# Patient Record
Sex: Female | Born: 1990 | Race: Black or African American | Hispanic: No | Marital: Single | Smoking: Never smoker
Health system: Southern US, Community
[De-identification: ages and names within clinical notes are randomized; demographics above are authoritative.]

---

## 2014-09-10 ENCOUNTER — Emergency Department (HOSPITAL_COMMUNITY): Payer: Self-pay

## 2014-09-10 ENCOUNTER — Emergency Department (HOSPITAL_COMMUNITY)
Admission: EM | Admit: 2014-09-10 | Discharge: 2014-09-10 | Disposition: A | Discharge status: Home / Self Care | Payer: Self-pay | Attending: Emergency Medicine | Admitting: Emergency Medicine

## 2014-09-10 ENCOUNTER — Encounter (HOSPITAL_COMMUNITY): Payer: Self-pay | Admitting: Emergency Medicine

## 2014-09-10 DIAGNOSIS — Z3202 Encounter for pregnancy test, result negative: Secondary | ICD-10-CM | POA: Insufficient documentation

## 2014-09-10 DIAGNOSIS — Z79899 Other long term (current) drug therapy: Secondary | ICD-10-CM | POA: Insufficient documentation

## 2014-09-10 DIAGNOSIS — R1031 Right lower quadrant pain: Secondary | ICD-10-CM | POA: Insufficient documentation

## 2014-09-10 DIAGNOSIS — R197 Diarrhea, unspecified: Secondary | ICD-10-CM | POA: Insufficient documentation

## 2014-09-10 DIAGNOSIS — D649 Anemia, unspecified: Secondary | ICD-10-CM | POA: Insufficient documentation

## 2014-09-10 LAB — COMPREHENSIVE METABOLIC PANEL
ALT: 13 U/L (ref 0–35)
AST: 16 U/L (ref 0–37)
Albumin: 3.8 g/dL (ref 3.5–5.2)
Alkaline Phosphatase: 61 U/L (ref 39–117)
Anion gap: 6 (ref 5–15)
BUN: 13 mg/dL (ref 6–23)
CHLORIDE: 105 mmol/L (ref 96–112)
CO2: 29 mmol/L (ref 19–32)
Calcium: 9.1 mg/dL (ref 8.4–10.5)
Creatinine, Ser: 0.7 mg/dL (ref 0.50–1.10)
GFR calc Af Amer: >90 mL/min (ref >90)
Glucose, Bld: 106 mg/dL — ABNORMAL HIGH (ref 70–99)
Potassium: 3.6 mmol/L (ref 3.5–5.1)
SODIUM: 140 mmol/L (ref 135–145)
Total Bilirubin: 0.8 mg/dL (ref 0.3–1.2)
Total Protein: 7.6 g/dL (ref 6.0–8.3)

## 2014-09-10 LAB — CBC WITH DIFFERENTIAL/PLATELET
Basophils Absolute: 0.1 10*3/uL (ref 0.0–0.1)
Basophils Relative: 1 % (ref 0–1)
Eosinophils Absolute: 0.3 10*3/uL (ref 0.0–0.7)
Eosinophils Relative: 4 % (ref 0–5)
HCT: 34.1 % — ABNORMAL LOW (ref 36.0–46.0)
HEMOGLOBIN: 11.5 g/dL — AB (ref 12.0–15.0)
Lymphocytes Relative: 28 % (ref 12–46)
Lymphs Abs: 2.3 10*3/uL (ref 0.7–4.0)
MCH: 29.9 pg (ref 26.0–34.0)
MCHC: 33.7 g/dL (ref 30.0–36.0)
MCV: 88.6 fL (ref 78.0–100.0)
Monocytes Absolute: 0.8 10*3/uL (ref 0.1–1.0)
Monocytes Relative: 10 % (ref 3–12)
NEUTROS ABS: 4.8 10*3/uL (ref 1.7–7.7)
NEUTROS PCT: 57 % (ref 43–77)
PLATELETS: 315 10*3/uL (ref 150–400)
RBC: 3.85 MIL/uL — ABNORMAL LOW (ref 3.87–5.11)
RDW: 11.8 % (ref 11.5–15.5)
WBC: 8.3 10*3/uL (ref 4.0–10.5)

## 2014-09-10 LAB — URINALYSIS, ROUTINE W REFLEX MICROSCOPIC
Bilirubin Urine: NEGATIVE
Glucose, UA: NEGATIVE mg/dL
Ketones, ur: NEGATIVE mg/dL
Leukocytes, UA: NEGATIVE
Nitrite: NEGATIVE
PH: 6 (ref 5.0–8.0)
Protein, ur: NEGATIVE mg/dL
SPECIFIC GRAVITY, URINE: 1.033 — AB (ref 1.005–1.030)
UROBILINOGEN UA: 1 mg/dL (ref 0.0–1.0)

## 2014-09-10 LAB — URINE MICROSCOPIC-ADD ON

## 2014-09-10 LAB — LIPASE, BLOOD: LIPASE: 28 U/L (ref 11–59)

## 2014-09-10 LAB — PREGNANCY, URINE: Preg Test, Ur: NEGATIVE

## 2014-09-10 MED ORDER — IOHEXOL 300 MG/ML  SOLN
50.0000 mL | Freq: Once | INTRAMUSCULAR | Status: AC | PRN
  Administered 2014-09-10: 50 mL via ORAL

## 2014-09-10 MED ORDER — MORPHINE SULFATE 4 MG/ML IJ SOLN
4.0000 mg | Freq: Once | INTRAMUSCULAR | Status: AC
  Administered 2014-09-10: 4 mg via INTRAVENOUS
  Filled 2014-09-10: qty 1

## 2014-09-10 MED ORDER — IOHEXOL 300 MG/ML  SOLN
100.0000 mL | Freq: Once | INTRAMUSCULAR | Status: AC | PRN
  Administered 2014-09-10: 100 mL via INTRAVENOUS

## 2014-09-10 NOTE — ED Notes (Signed)
Pt ambulating independently w/ steady gait on d/c in no acute distress, A&Ox4.D/c instructions reviewed w/ pt and family - pt and family deny any further questions or concerns at present.  

## 2014-09-10 NOTE — ED Provider Notes (Signed)
CSN: [id number]     Arrival date & time 09/10/14  1600 History   First MD Initiated Contact with Patient 09/10/14 1750     Chief Complaint  Patient presents with  . Abdominal Pain  . Diarrhea     (Consider location/radiation/quality/duration/timing/severity/associated sxs/prior Treatment) The history is provided by the patient and medical records. No language interpreter was used.      Sabrina Rocha is a 24 y.o. female  with no medical Hx presents to the Emergency Department complaining of gradual, intermittent, progressively worsening generalized abd pain worse in the right lower quadrant onset 3 days ago, persistent until today but currently resolved.  Associated symptoms include multiple episodes of diarrhea each day described as green and soft.  Pt denies melena or hematochezia.  Pt reports her abd pain is sharp in nature, rated at a 8/10.  Pt reports the pain only goes away when she sleeps and then returns approx 30 min after she awakes.  She reports she has been eating, but this makes the pain worse.  Pt reports she has been taking Pepto-Bismol relief.  Patient reports that the day before abdominal pain began her bilateral legs were aching but they resolved by that evening and she's had no further leg pain. She denies increased abdominal pain with walking. Pt denies fever, chills, headache, neck pain, chest pain, SOB, nausea, vomiting, weakness, dizziness, syncope, dysuria, hematuria.  Patient reports vaginal discharge no worse or different than usual. She reports she is in a monogamous same-sex relationship and has never had an STD.  LMP: Aug 10, 2014.  No abdominal surgeries.  No recent heavy lifting with her job.     History reviewed. No pertinent past medical history. History reviewed. No pertinent past surgical history. No family history on file. History  Substance Use Topics  . Smoking status: Never Smoker   . Smokeless tobacco: Not on file  . Alcohol Use: No   OB History     No data available     Review of Systems  Constitutional: Negative for fever, diaphoresis, appetite change, fatigue and unexpected weight change.  HENT: Negative for mouth sores.   Eyes: Negative for visual disturbance.  Respiratory: Negative for cough, chest tightness, shortness of breath and wheezing.   Cardiovascular: Negative for chest pain.  Gastrointestinal: Positive for abdominal pain and diarrhea. Negative for nausea, vomiting and constipation.  Endocrine: Negative for polydipsia, polyphagia and polyuria.  Genitourinary: Negative for dysuria, urgency, frequency and hematuria.  Musculoskeletal: Negative for back pain and neck stiffness.  Skin: Negative for rash.  Allergic/Immunologic: Negative for immunocompromised state.  Neurological: Negative for syncope, light-headedness and headaches.  Hematological: Does not bruise/bleed easily.  Psychiatric/Behavioral: Negative for sleep disturbance. The patient is not nervous/anxious.       Allergies  Review of patient's allergies indicates no known allergies.  Home Medications   Prior to Admission medications   Medication Sig Start Date End Date Taking? Authorizing Provider  Chlorpheniramine Maleate (ALLERGY PO) Take 1 tablet by mouth daily.   Yes Historical Provider, MD  Pseudoeph-Doxylamine-DM-APAP (NYQUIL PO) Take 2 capsules by mouth 2 (two) times daily as needed (cold symptoms).   Yes Historical Provider, MD  pseudoephedrine-acetaminophen (TYLENOL SINUS) 30-500 MG TABS Take 1 tablet by mouth every 4 (four) hours as needed (sinus congestion).   Yes Historical Provider, MD   BP 103/60 mmHg  Pulse 71  Resp 16  SpO2 100%  LMP 08/10/2014 (Exact Date) Physical Exam  Constitutional: She appears well-developed and well-nourished. No  distress.  Awake, alert, nontoxic appearance  HENT:  Head: Normocephalic and atraumatic.  Mouth/Throat: Oropharynx is clear and moist. No oropharyngeal exudate.  Eyes: Conjunctivae are normal. No  scleral icterus.  Neck: Normal range of motion. Neck supple.  Cardiovascular: Normal rate, regular rhythm, normal heart sounds and intact distal pulses.   No murmur heard. No tachycardia  Pulmonary/Chest: Effort normal and breath sounds normal. No respiratory distress. She has no wheezes.  Equal chest expansion  Abdominal: Soft. Bowel sounds are normal. She exhibits no distension and no mass. There is no tenderness. There is no rebound, no guarding, no CVA tenderness and no tenderness at McBurney's point.  Abdomen soft and nontender No rebound or guarding No CVA tenderness  Musculoskeletal: Normal range of motion. She exhibits no edema.  Neurological: She is alert.  Speech is clear and goal oriented Moves extremities without ataxia  Skin: Skin is warm and dry. She is not diaphoretic.  Psychiatric: She has a normal mood and affect.  Nursing note and vitals reviewed.   ED Course  Procedures (including critical care time) Labs Review Labs Reviewed  CBC WITH DIFFERENTIAL/PLATELET - Abnormal; Notable for the following:    RBC 3.85 (*)    Hemoglobin 11.5 (*)    HCT 34.1 (*)    All other components within normal limits  COMPREHENSIVE METABOLIC PANEL - Abnormal; Notable for the following:    Glucose, Bld 106 (*)    All other components within normal limits  URINALYSIS, ROUTINE W REFLEX MICROSCOPIC - Abnormal; Notable for the following:    Specific Gravity, Urine 1.033 (*)    Hgb urine dipstick SMALL (*)    All other components within normal limits  URINE MICROSCOPIC-ADD ON - Abnormal; Notable for the following:    Bacteria, UA FEW (*)    All other components within normal limits  WET PREP, GENITAL  LIPASE, BLOOD  PREGNANCY, URINE  RPR  HIV ANTIBODY (ROUTINE TESTING)  POC URINE PREG, ED  GC/CHLAMYDIA PROBE AMP ([place])    Imaging Review Ct Abdomen Pelvis W Contrast  09/10/2014   CLINICAL DATA:  Abdominal pain and diarrhea. Initial encounter. Mid abdominal pain extending  to the pelvic area.  EXAM: CT ABDOMEN AND PELVIS WITH CONTRAST  TECHNIQUE: Multidetector CT imaging of the abdomen and pelvis was performed using the standard protocol following bolus administration of intravenous contrast.  CONTRAST:  50mL OMNIPAQUE IOHEXOL 300 MG/ML SOLN, OMNIPAQUE IOHEXOL 300 MG/ML SOLN  COMPARISON:  None.  FINDINGS: Musculoskeletal:  No aggressive osseous lesions.  Lung Bases: Clear.  Liver: Tiny low-density lesion in the RIGHT hepatic dome probably represents a cyst.  Spleen:  Normal.  Gallbladder:  Normal.  Common bile duct:  Normal.  Pancreas:  Normal.  Adrenal glands:  Normal bilaterally.  Kidneys: Normal enhancement. No calculi. Grossly, ureters appear within normal limits. Evaluation is degraded by paucity of abdominal fat.  Stomach:  Normal.  Small bowel: Normal. No obstruction. Contrast reaches the proximal ileum. There is no contrast in the terminal ileum or the ileocecal valve.  Colon: The appendix is not confidently identified. No definite RIGHT lower quadrant inflammatory changes. No inflammatory changes of colon.  Pelvic Genitourinary: Physiologic appearance of the urinary bladder, normal urinary bladder. Physiologic appearance of the uterus and adnexa.  Peritoneum: No free air or free fluid.  Vasculature: Normal.  Body Wall: Normal.  IMPRESSION: Negative CT abdomen and pelvis.   Electronically Signed   By: Andreas Newport M.D.   On: 09/10/2014 21:29  EKG Interpretation None      MDM   Final diagnoses:  RLQ abdominal pain  Diarrhea    Sabrina Rocha presents with RLQ abd pain, currently asymptomatic but reports that she was hurting significantly prior to arrival.  Abdomen soft and nontender. Patient with vaginal discharge unchanged from previous. Will obtain pelvic exam, CT scan and reassess.  10:20 PM Labs reassuring. Urinalysis without evidence of urinary tract infection and CBC without leukocytosis. Mild anemia at 11.5. Unknown patient baseline however  she denies shortness of breath, tachycardia, fatigue or weakness. Pregnant test negative.  Patient without episodes of emesis or diarrhea here in the emergency department. No return of her pain. CT scan without evidence of acute abnormality. The appendix is not identified however there is no evidence of right lower quadrant inflammatory changes.  Patient declines her pelvic exam at this time. I have cautioned her against my concerns about the potential for PID and vaginal infections.  Patient is resting comfortably and I do not believe that she has an ovarian torsion.  She states understanding of risk to decline her pelvic exam and continues to decline.  Patient given strict return precautions including fevers, persistent vomiting, worsening lower abdominal pain or any other concerning symptoms.  Patient is nontoxic, nonseptic appearing, in no apparent distress.  Patient's pain and other symptoms adequately managed in emergency department.  Fluid bolus given.  Labs, imaging and vitals reviewed.  Patient does not meet the SIRS or Sepsis criteria.  On repeat exam patient does not have a surgical abdomin and there are no peritoneal signs.  No indication of appendicitis, bowel obstruction, bowel perforation, cholecystitis, diverticulitis, or ectopic pregnancy.    I have personally reviewed patient's vitals, nursing note and any pertinent labs or imaging.  I performed an undressed physical exam.    It has been determined that no acute conditions requiring further emergency intervention are present at this time. The patient/guardian have been advised of the diagnosis and plan. I reviewed all labs and imaging including any potential incidental findings. We have discussed signs and symptoms that warrant return to the ED and they are listed in the discharge instructions.    Vital signs are stable at discharge.   BP 103/60 mmHg  Pulse 71  Resp 16  SpO2 100%  LMP 08/10/2014 (Exact  Date)          Dierdre ForthHannah Donnica Jarnagin, PA-C 09/10/14 2222  Ethelda ChickMartha K Linker, MD 09/10/14 2226

## 2014-09-10 NOTE — Discharge Instructions (Signed)
1. Medications: imodium for diarrhea, tylenol for abd pain as needed , usual home medications 2. Treatment: rest, drink plenty of fluids,  3. Follow Up: Please followup with your primary doctor in 2 days for discussion of your diagnoses and further evaluation after today's visit; if you do not have a primary care doctor use the resource guide provided to find one; Please return to the ER for return of pain, change in vaginal discharge, vomiting, fevers or other concerns   Abdominal Pain, Women Abdominal (stomach, pelvic, or belly) pain can be caused by many things. It is important to tell your doctor:  The location of the pain.  Does it come and go or is it present all the time?  Are there things that start the pain (eating certain foods, exercise)?  Are there other symptoms associated with the pain (fever, nausea, vomiting, diarrhea)? All of this is helpful to know when trying to find the cause of the pain. CAUSES   Stomach: virus or bacteria infection, or ulcer.  Intestine: appendicitis (inflamed appendix), regional ileitis (Crohn's disease), ulcerative colitis (inflamed colon), irritable bowel syndrome, diverticulitis (inflamed diverticulum of the colon), or cancer of the stomach or intestine.  Gallbladder disease or stones in the gallbladder.  Kidney disease, kidney stones, or infection.  Pancreas infection or cancer.  Fibromyalgia (pain disorder).  Diseases of the female organs:  Uterus: fibroid (non-cancerous) tumors or infection.  Fallopian tubes: infection or tubal pregnancy.  Ovary: cysts or tumors.  Pelvic adhesions (scar tissue).  Endometriosis (uterus lining tissue growing in the pelvis and on the pelvic organs).  Pelvic congestion syndrome (female organs filling up with blood just before the menstrual period).  Pain with the menstrual period.  Pain with ovulation (producing an egg).  Pain with an IUD (intrauterine device, birth control) in the  uterus.  Cancer of the female organs.  Functional pain (pain not caused by a disease, may improve without treatment).  Psychological pain.  Depression. DIAGNOSIS  Your doctor will decide the seriousness of your pain by doing an examination.  Blood tests.  X-rays.  Ultrasound.  CT scan (computed tomography, special type of X-ray).  MRI (magnetic resonance imaging).  Cultures, for infection.  Barium enema (dye inserted in the large intestine, to better view it with X-rays).  Colonoscopy (looking in intestine with a lighted tube).  Laparoscopy (minor surgery, looking in abdomen with a lighted tube).  Major abdominal exploratory surgery (looking in abdomen with a large incision). TREATMENT  The treatment will depend on the cause of the pain.   Many cases can be observed and treated at home.  Over-the-counter medicines recommended by your caregiver.  Prescription medicine.  Antibiotics, for infection.  Birth control pills, for painful periods or for ovulation pain.  Hormone treatment, for endometriosis.  Nerve blocking injections.  Physical therapy.  Antidepressants.  Counseling with a psychologist or psychiatrist.  Minor or major surgery. HOME CARE INSTRUCTIONS   Do not take laxatives, unless directed by your caregiver.  Take over-the-counter pain medicine only if ordered by your caregiver. Do not take aspirin because it can cause an upset stomach or bleeding.  Try a clear liquid diet (broth or water) as ordered by your caregiver. Slowly move to a bland diet, as tolerated, if the pain is related to the stomach or intestine.  Have a thermometer and take your temperature several times a day, and record it.  Bed rest and sleep, if it helps the pain.  Avoid sexual intercourse, if it causes  pain.  Avoid stressful situations.  Keep your follow-up appointments and tests, as your caregiver orders.  If the pain does not go away with medicine or surgery, you  may try:  Acupuncture.  Relaxation exercises (yoga, meditation).  Group therapy.  Counseling. SEEK MEDICAL CARE IF:   You notice certain foods cause stomach pain.  Your home care treatment is not helping your pain.  You need stronger pain medicine.  You want your IUD removed.  You feel faint or lightheaded.  You develop nausea and vomiting.  You develop a rash.  You are having side effects or an allergy to your medicine. SEEK IMMEDIATE MEDICAL CARE IF:   Your pain does not go away or gets worse.  You have a fever.  Your pain is felt only in portions of the abdomen. The right side could possibly be appendicitis. The left lower portion of the abdomen could be colitis or diverticulitis.  You are passing blood in your stools (bright red or black tarry stools, with or without vomiting).  You have blood in your urine.  You develop chills, with or without a fever.  You pass out. MAKE SURE YOU:   Understand these instructions.  Will watch your condition.  Will get help right away if you are not doing well or get worse. Document Released: 05/31/2007 Document Revised: 12/18/2013 Document Reviewed: 06/20/2009 [facility] Patient Information 2015 [employer], [place]. This information is not intended to replace advice given to you by your health care provider. Make sure you discuss any questions you have with your health care provider.

## 2014-09-10 NOTE — ED Notes (Signed)
Pt c/o abd pain and diarrhea, pt states that it started with legs aching on Thursday. Pt denies n/v.

## 2014-09-12 LAB — HIV ANTIBODY (ROUTINE TESTING W REFLEX): HIV-1/HIV-2 Ab: NONREACTIVE

## 2014-09-12 LAB — RPR: RPR Ser Ql: NONREACTIVE

## 2015-07-03 IMAGING — CT CT ABD-PELV W/ CM
1 of 2 series · 15 of 32 positions shown, 19 images · IV contrast (OMNIPAQUE 300)
Comparison: None.

CLINICAL DATA: Abdominal pain and diarrhea. Initial encounter. Mid
abdominal pain extending to the pelvic area.

EXAM:
CT ABDOMEN AND PELVIS WITH CONTRAST
TECHNIQUE: Multidetector CT imaging of the abdomen and pelvis was performed
using the standard protocol following bolus administration of
intravenous contrast.
CONTRAST:  50mL OMNIPAQUE IOHEXOL 300 MG/ML SOLN, 100mL OMNIPAQUE
IOHEXOL 300 MG/ML SOLN

[Series 2: abd/pel with · axial · 0.67mm/px · z∈[+964,+1344]mm · 15 of 84 slices shown, 19 images]
[im 4/84  soft-tissue]
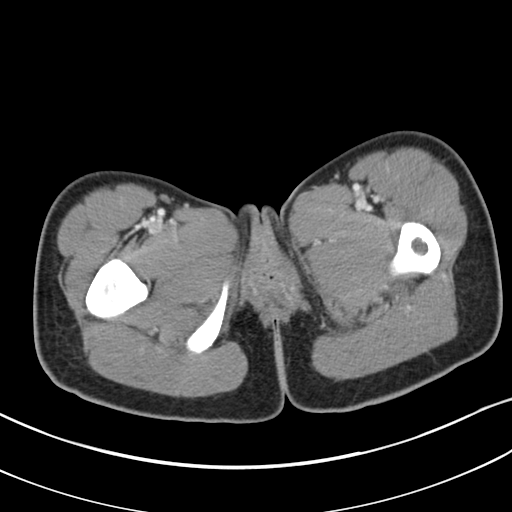
[im 4/84  bone]
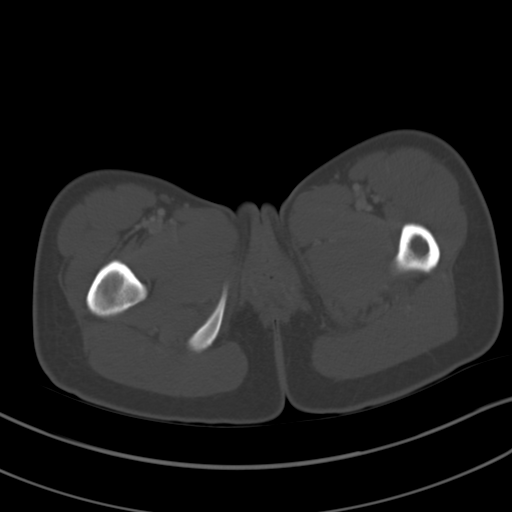
[im 11/84  soft-tissue]
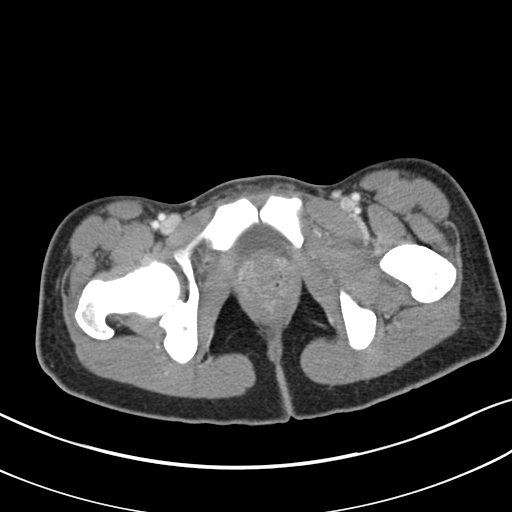
[im 19/84  soft-tissue]
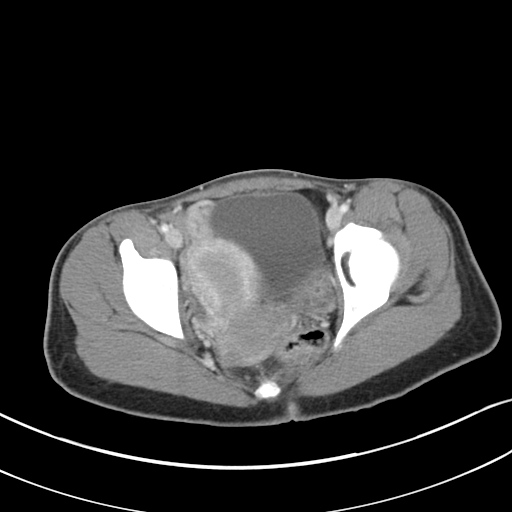
[im 22/84  soft-tissue]
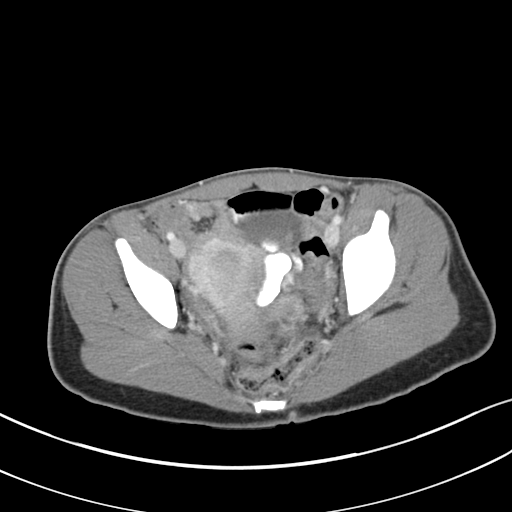
[im 29/84  soft-tissue]
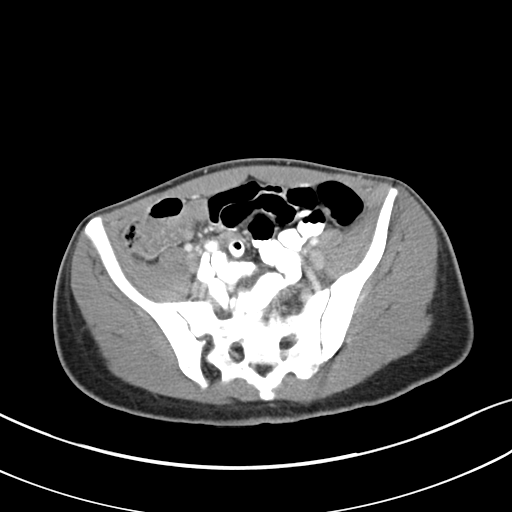
[im 37/84  soft-tissue]
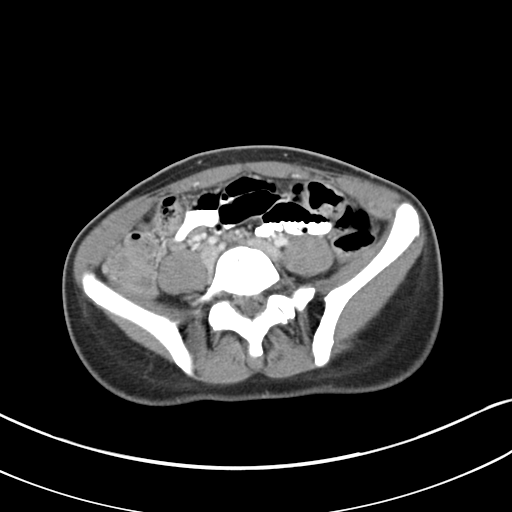
[im 44/84  soft-tissue]
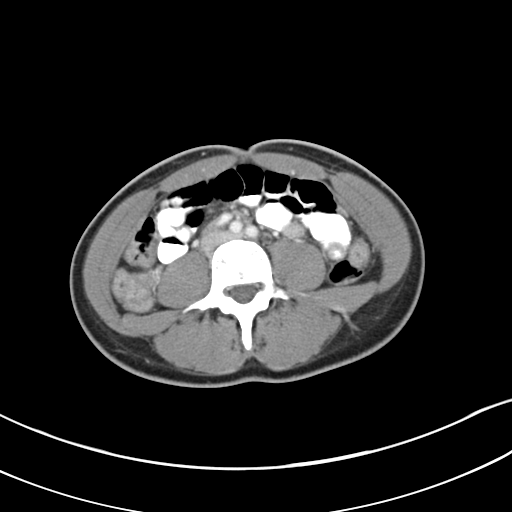
[im 47/84  soft-tissue]
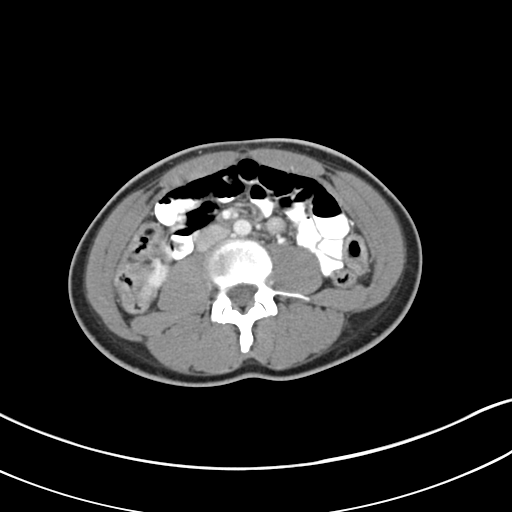
[im 55/84  soft-tissue]
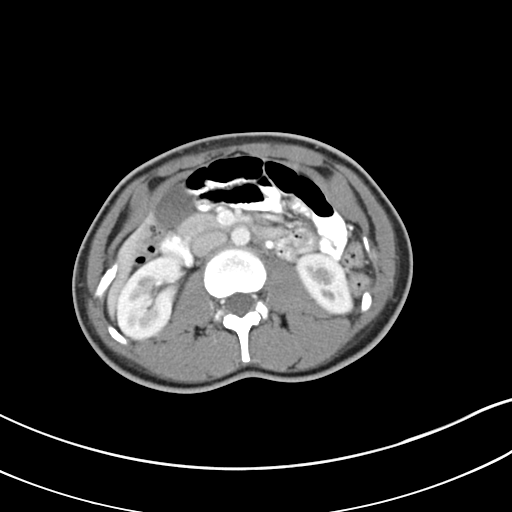
[im 55/84  bone]
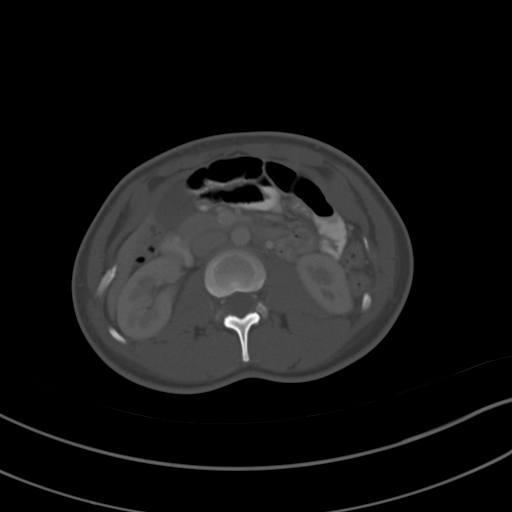
[im 62/84  soft-tissue]
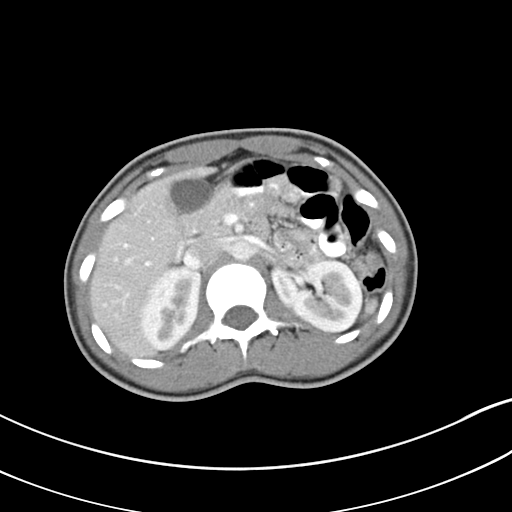
[im 65/84  soft-tissue]
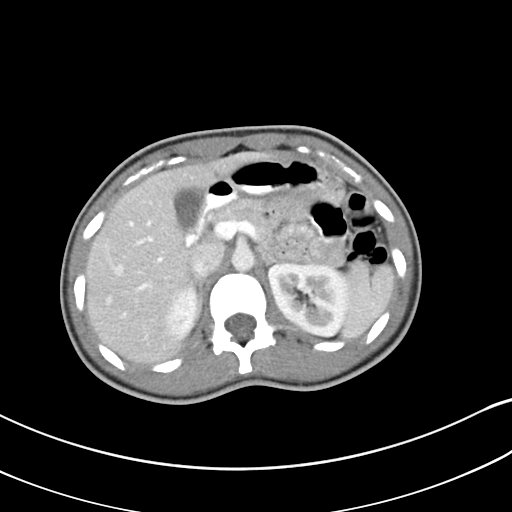
[im 69/84  lung]
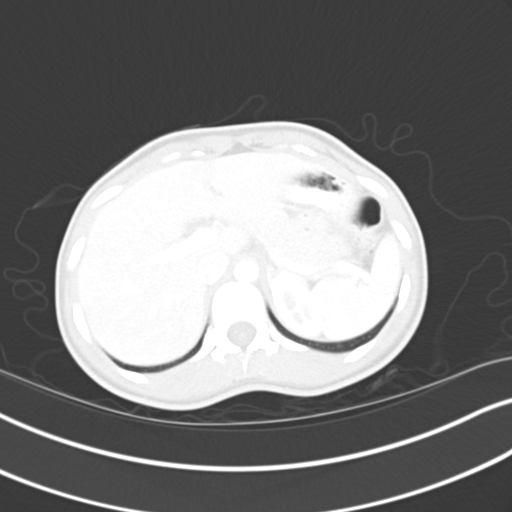
[im 73/84  soft-tissue]
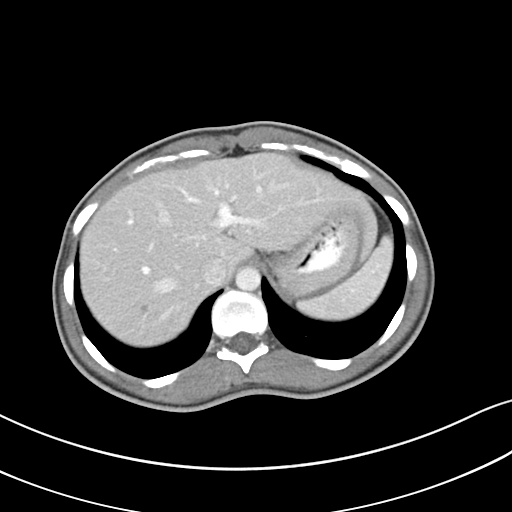
[im 73/84  lung]
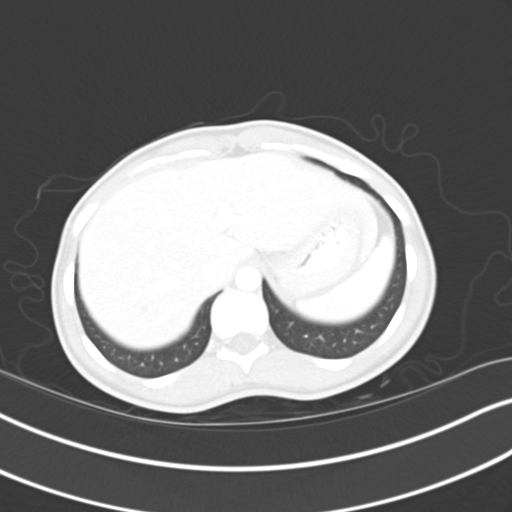
[im 76/84  lung]
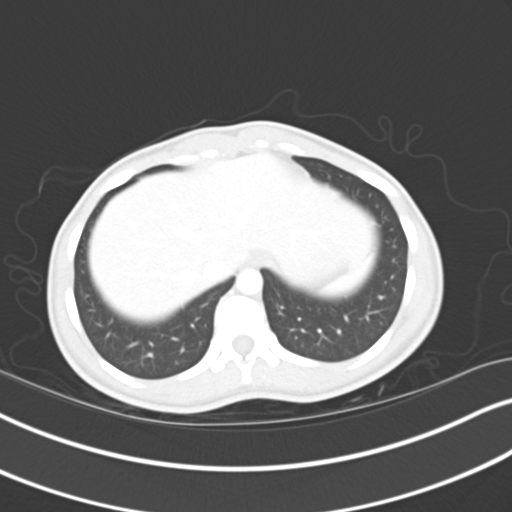
[im 80/84  soft-tissue]
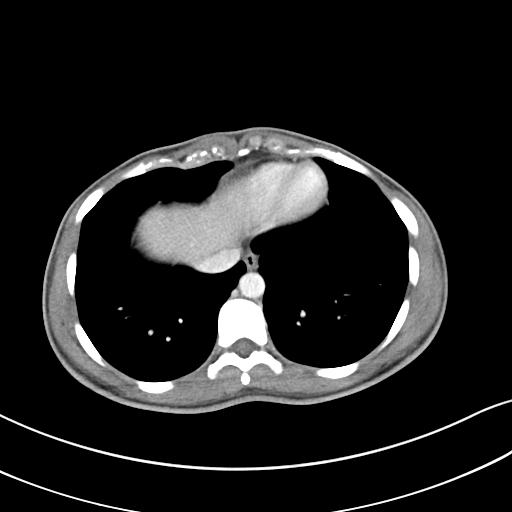
[im 80/84  lung]
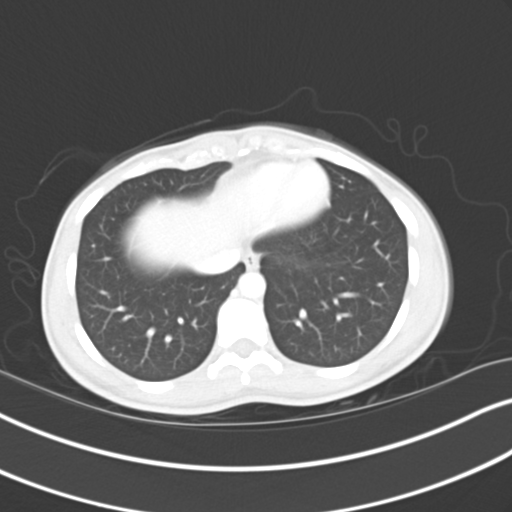

[15 of 32 positions shown; findings below may reference images not displayed]

FINDINGS: Musculoskeletal:  No aggressive osseous lesions.

Lung Bases: Clear.

Liver: Tiny low-density lesion in the RIGHT hepatic dome probably
represents a cyst.

Spleen:  Normal.

Gallbladder:  Normal.

Common bile duct:  Normal.

Pancreas:  Normal.

Adrenal glands:  Normal bilaterally.

Kidneys: Normal enhancement. No calculi. Grossly, ureters appear
within normal limits. Evaluation is degraded by paucity of abdominal
fat.

Stomach:  Normal.

Small bowel: Normal. No obstruction. Contrast reaches the proximal
ileum. There is no contrast in the terminal ileum or the ileocecal
valve.

Colon: The appendix is not confidently identified. No definite RIGHT
lower quadrant inflammatory changes. No inflammatory changes of
colon.

Pelvic Genitourinary: Physiologic appearance of the urinary bladder,
normal urinary bladder. Physiologic appearance of the uterus and
adnexa.

Peritoneum: No free air or free fluid.

Vasculature: Normal.

Body Wall: Normal.
IMPRESSION: Negative CT abdomen and pelvis.
